# Patient Record
Sex: Female | Born: 1995 | Race: White | Hispanic: No | Marital: Single | Smoking: Never smoker
Health system: Southern US, Community
[De-identification: ages and names within clinical notes are randomized; demographics above are authoritative.]

## PROBLEM LIST (undated history)

## (undated) DIAGNOSIS — R0989 Other specified symptoms and signs involving the circulatory and respiratory systems: Secondary | ICD-10-CM

## (undated) DIAGNOSIS — K589 Irritable bowel syndrome without diarrhea: Secondary | ICD-10-CM

## (undated) DIAGNOSIS — E559 Vitamin D deficiency, unspecified: Secondary | ICD-10-CM

## (undated) DIAGNOSIS — K900 Celiac disease: Secondary | ICD-10-CM

---
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
